# Patient Record
Sex: Female | Born: 1995 | Race: Black or African American | Hispanic: No | Marital: Single | Smoking: Never smoker
Health system: Southern US, Community
[De-identification: ages and names within clinical notes are randomized; demographics above are authoritative.]

## PROBLEM LIST (undated history)

## (undated) DIAGNOSIS — J45909 Unspecified asthma, uncomplicated: Secondary | ICD-10-CM

---
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
## 2017-10-01 ENCOUNTER — Other Ambulatory Visit: Payer: Self-pay

## 2017-10-01 ENCOUNTER — Ambulatory Visit (HOSPITAL_COMMUNITY)
Admission: EM | Admit: 2017-10-01 | Discharge: 2017-10-01 | Disposition: A | Discharge status: Home / Self Care | Payer: Worker's Compensation | Attending: Internal Medicine | Admitting: Internal Medicine

## 2017-10-01 ENCOUNTER — Ambulatory Visit (INDEPENDENT_AMBULATORY_CARE_PROVIDER_SITE_OTHER): Payer: Worker's Compensation

## 2017-10-01 ENCOUNTER — Encounter (HOSPITAL_COMMUNITY): Payer: Self-pay

## 2017-10-01 DIAGNOSIS — S9031XA Contusion of right foot, initial encounter: Secondary | ICD-10-CM

## 2017-10-01 MED ORDER — IBUPROFEN 600 MG PO TABS: 600.0000 mg | ORAL_TABLET | Freq: Four times a day (QID) | ORAL | 0 refills | Status: DC | PRN

## 2017-10-01 NOTE — Discharge Instructions (Addendum)
Use anti-inflammatories for pain/swelling.  Tylenol [phone] mg every 8 hours with food.  Ice and elevate foot   Expect gradual improvement in 1-2 weeks

## 2017-10-01 NOTE — ED Triage Notes (Signed)
Pt presents today with right foot pain that started yesterday when a heavy box was dropped on her foot. Is not able to walk on it without pain. Foot has some swelling.

## 2017-10-02 NOTE — ED Provider Notes (Signed)
[facility]    CSN: [id number] Arrival date & time: 10/01/17  1703     History   Chief Complaint Chief Complaint  Patient presents with  . Foot Pain    HPI Victoria Fritz is a 22 y.o. female history of asthma presenting today for evaluation of right foot injury. She was working and accidentaly dropped a heavy  Box on her foot. She does not know what was in the box. This happened 2-3 days ago. Since she has had progressively worsening pain and difficulty walking. Has not taken anything for pain.   HPI  Past Medical History:  Diagnosis Date  . Asthma     There are no active problems to display for this patient.   History reviewed. No pertinent surgical history.  OB History    Gravida  1   Para      Term      Preterm      AB      Living        SAB      TAB      Ectopic      Multiple      Live Births               Home Medications    Prior to Admission medications   Not on File    Family History No family history on file.  Social History Social History   Tobacco Use  . Smoking status: Never Smoker  . Smokeless tobacco: Never Used  Substance Use Topics  . Alcohol use: No  . Drug use: No     Allergies   Patient has no known allergies.   Review of Systems Review of Systems  Constitutional: Negative for fatigue and fever.  Eyes: Negative for visual disturbance.  Respiratory: Negative for shortness of breath.   Cardiovascular: Negative for chest pain.  Gastrointestinal: Negative for abdominal pain, nausea and vomiting.  Musculoskeletal: Positive for arthralgias and myalgias. Negative for joint swelling.  Skin: Negative for color change, rash and wound.  Neurological: Negative for dizziness, weakness, light-headedness and headaches.     Physical Exam Triage Vital Signs ED Triage Vitals  Enc Vitals Group     BP 10/01/17 1807 (!) 104/59     Pulse Rate 10/01/17 1807 93     Resp 10/01/17 1807 16     Temp 10/01/17  1807 99.2 F (37.3 C)     Temp Source 10/01/17 1807 Oral     SpO2 10/01/17 1807 100 %     Weight --      Height --      Head Circumference --      Peak Flow --      Pain Score 10/01/17 1816 9     Pain Loc --      Pain Edu? --      Excl. in GC? --    No data found.  Updated Vital Signs BP (!) 104/59 (BP Location: Left Arm)   Pulse 93   Temp 99.2 F (37.3 C) (Oral)   Resp 16   LMP 08/26/2017   SpO2 100%   Breastfeeding? Unknown   Visual Acuity Right Eye Distance:   Left Eye Distance:   Bilateral Distance:    Right Eye Near:   Left Eye Near:    Bilateral Near:     Physical Exam  Constitutional: She is oriented to person, place, and time. She appears well-developed and well-nourished.  No acute distress  HENT:  Head: Normocephalic and atraumatic.  Nose: Nose normal.  Eyes: Conjunctivae are normal.  Neck: Neck supple.  Cardiovascular: Normal rate.  Pulmonary/Chest: Effort normal. No respiratory distress.  Abdominal: She exhibits no distension.  Musculoskeletal: Normal range of motion.  No obvious deformity or swelling to left foot. Nontender over medial and lateral malleolus, tender to proximal metatarsals, able to wiggle toes, dorsalis pedis 2 +, cap refill < 2 sec  Neurological: She is alert and oriented to person, place, and time.  Skin: Skin is warm and dry.  Psychiatric: She has a normal mood and affect.  Nursing note and vitals reviewed.    UC Treatments / Results  Labs (all labs ordered are listed, but only abnormal results are displayed) Labs Reviewed - No data to display  EKG None  Radiology Dg Foot Complete Right  Result Date: 10/01/2017 CLINICAL DATA:  Right foot pain post dropping heavy box on dorsum of foot x 1 day. Pain centered around the base of all the metatarsals which radiates into toes. Pain described as constant and sharp. Pain increases with motion and especially when bearing weight. Numbness noted in toes. Previous avulsion fx of  proximal medial aspect of the distal phalanx x4 years ago. Nondiabetic. EXAM: RIGHT FOOT COMPLETE - 3+ VIEW COMPARISON:  None. FINDINGS: There is no evidence of fracture or dislocation. There is no evidence of arthropathy or other focal bone abnormality. Soft tissues are unremarkable. IMPRESSION: Negative. Electronically Signed   By: Amie Portlandavid  Ormond M.D.   On: 10/01/2017 19:27    Procedures Procedures (including critical care time)  Medications Ordered in UC Medications - No data to display  Initial Impression / Assessment and Plan / UC Course  I have reviewed the triage vital signs and the nursing notes.  Pertinent labs & imaging results that were available during my care of the patient were reviewed by me and considered in my medical decision making (see chart for details).    Xray negative for fracture, likely sprain vs. Contusion. Recommended conservative treatment below. Acewrap provided. Discussed strict return precautions. Patient verbalized understanding and is agreeable with plan.  Final Clinical Impressions(s) / UC Diagnoses   Final diagnoses:  Contusion of right foot, initial encounter     Discharge Instructions     Use anti-inflammatories for pain/swelling.  Tylenol [phone] mg every 8 hours with food.  Ice and elevate foot   Expect gradual improvement in 1-2 weeks     ED Prescriptions    Medication Sig Dispense Auth. Provider   ibuprofen (ADVIL,MOTRIN) 600 MG tablet  (Status: Discontinued) Take 1 tablet (600 mg total) by mouth every 6 (six) hours as needed. 30 tablet Arsalan Brisbin, [place] C, PA-C     Controlled Substance Prescriptions [place] Controlled Substance Registry consulted? Not Applicable   Lew DawesWieters, Brownie Nehme C, [place]-C 10/02/17 [phone]

## 2018-12-11 IMAGING — DX DG FOOT COMPLETE 3+V*R*
3 series · 3 of 3 positions shown · non-contrast
Comparison: None.

CLINICAL DATA: Right foot pain post dropping heavy box on dorsum of
foot x 1 day. Pain centered around the base of all the metatarsals
which radiates into toes. Pain described as constant and sharp. Pain
increases with motion and especially when bearing weight. Numbness
noted in toes. Previous avulsion fx of proximal medial aspect of the
distal phalanx x4 years ago. Nondiabetic.

EXAM:
RIGHT FOOT COMPLETE - 3+ VIEW

[foot ap]
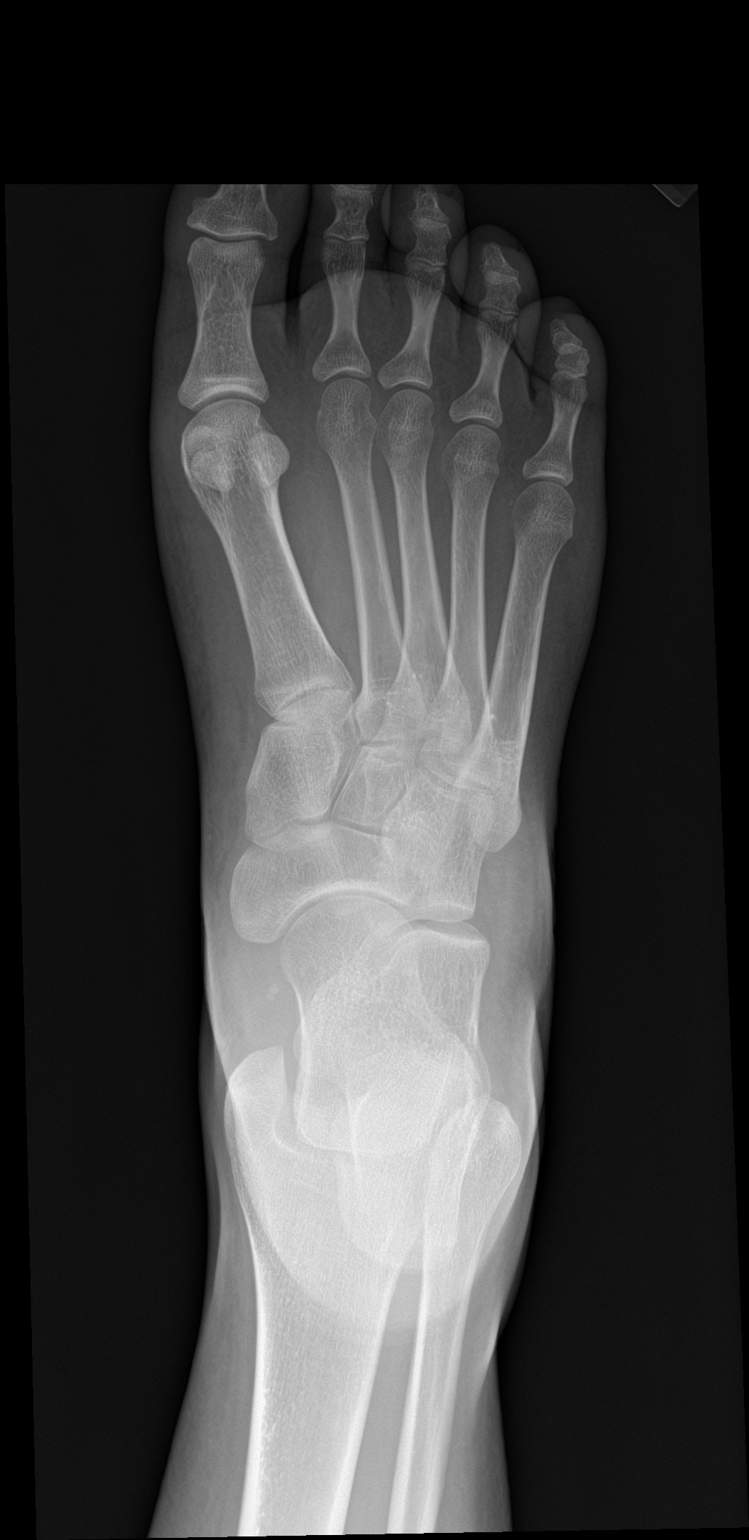

[foot obl]
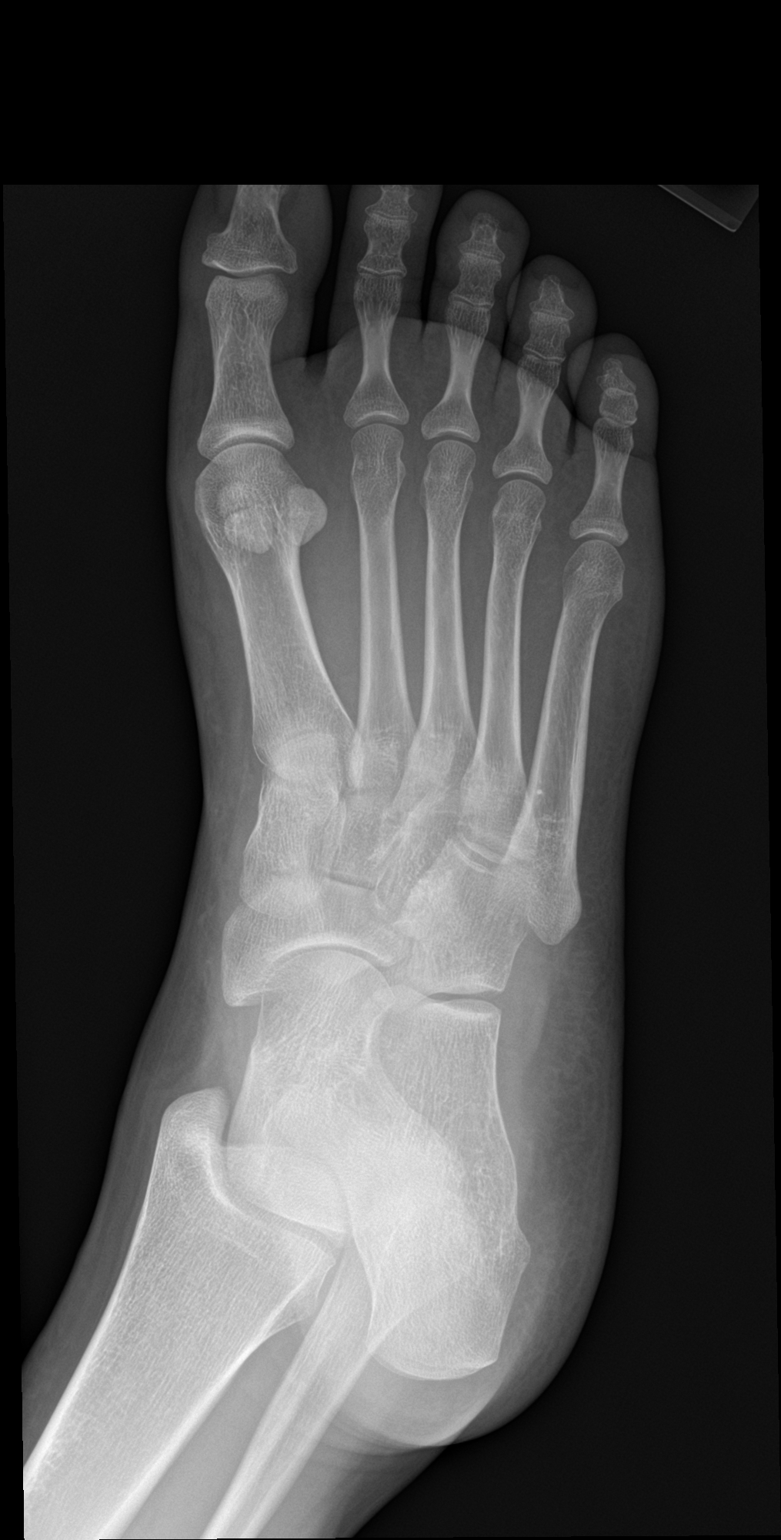

[foot lat]
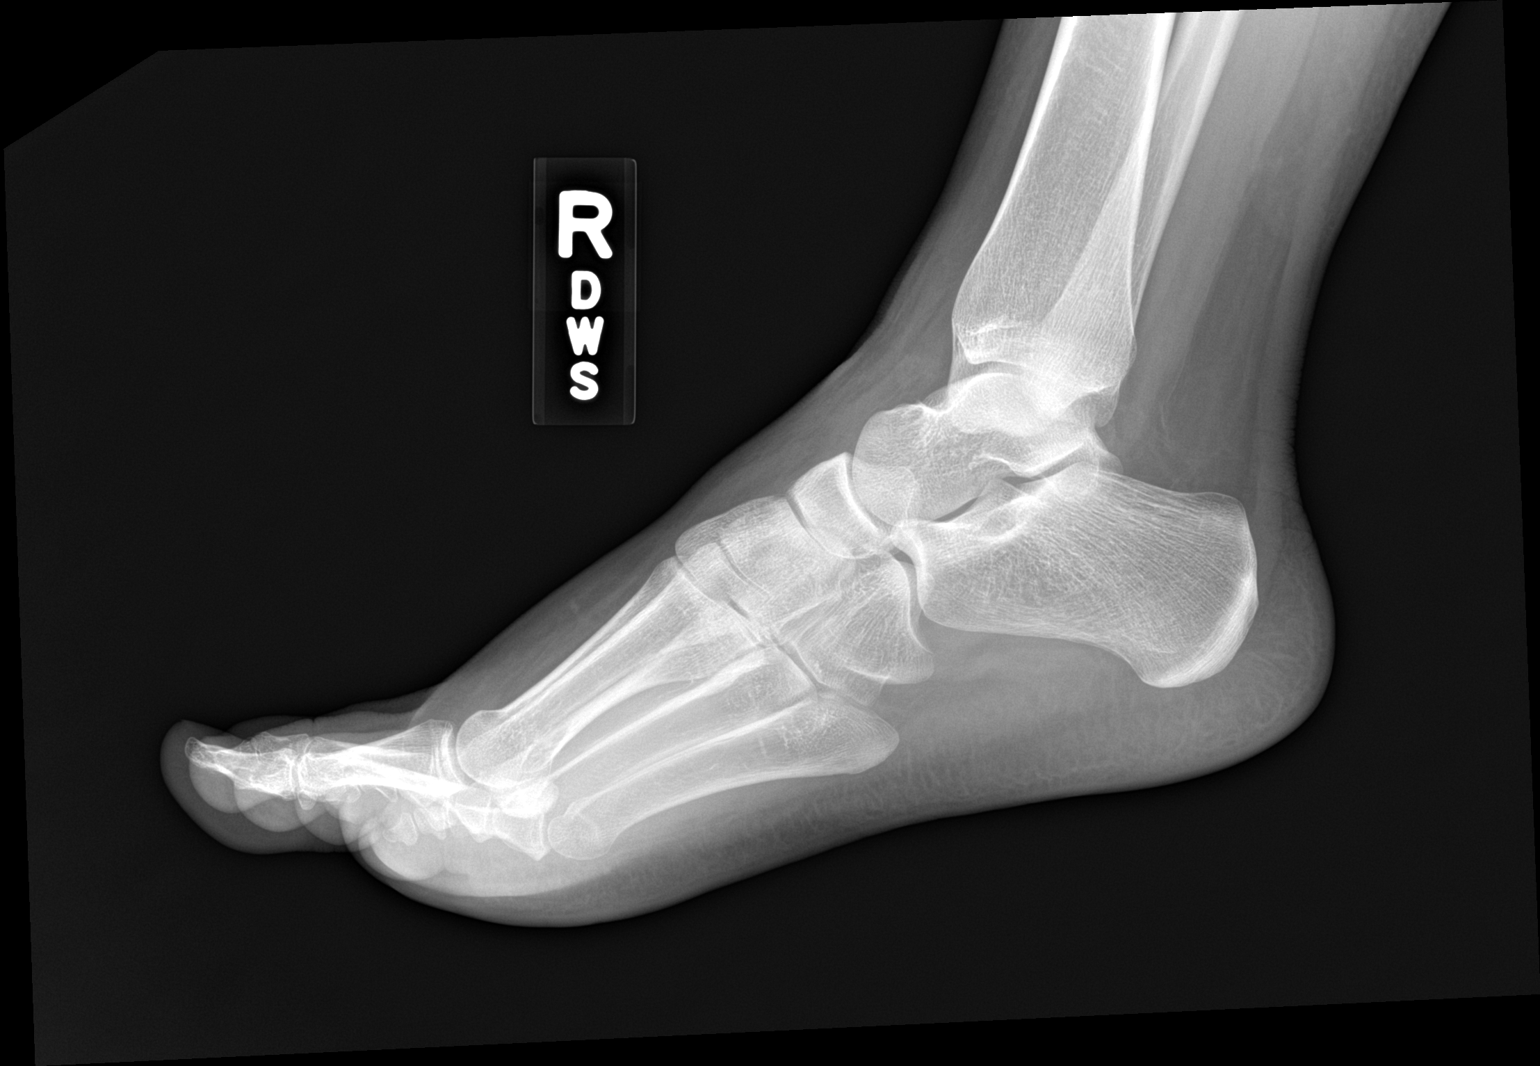

[3 of 3 positions shown; findings below may reference images not displayed]

FINDINGS: There is no evidence of fracture or dislocation. There is no
evidence of arthropathy or other focal bone abnormality. Soft
tissues are unremarkable.
IMPRESSION: Negative.

## 2019-01-17 ENCOUNTER — Other Ambulatory Visit: Payer: Self-pay

## 2019-01-17 DIAGNOSIS — Z20822 Contact with and (suspected) exposure to covid-19: Secondary | ICD-10-CM

## 2019-01-19 LAB — NOVEL CORONAVIRUS, NAA: SARS-CoV-2, NAA: NOT DETECTED
